# Patient Record
Sex: Male | Born: 1995 | Race: White | Hispanic: No | Marital: Married | Smoking: Never smoker
Health system: Southern US, Community
[De-identification: ages and names within clinical notes are randomized; demographics above are authoritative.]

## PROBLEM LIST (undated history)

## (undated) DIAGNOSIS — F329 Major depressive disorder, single episode, unspecified: Secondary | ICD-10-CM

## (undated) DIAGNOSIS — K219 Gastro-esophageal reflux disease without esophagitis: Secondary | ICD-10-CM

## (undated) DIAGNOSIS — F32A Depression, unspecified: Secondary | ICD-10-CM

## (undated) HISTORY — DX: Depression, unspecified: F32.A

## (undated) HISTORY — DX: Gastro-esophageal reflux disease without esophagitis: K21.9

## (undated) HISTORY — DX: Major depressive disorder, single episode, unspecified: F32.9

---
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
## 2015-02-02 ENCOUNTER — Ambulatory Visit (INDEPENDENT_AMBULATORY_CARE_PROVIDER_SITE_OTHER): Payer: 59 | Admitting: Family Medicine

## 2015-02-02 ENCOUNTER — Encounter: Payer: Self-pay | Admitting: Family Medicine

## 2015-02-02 VITALS — BP 128/81 | HR 65 | Temp 97.7°F | Ht 68.6 in | Wt 167.8 lb

## 2015-02-02 DIAGNOSIS — J029 Acute pharyngitis, unspecified: Secondary | ICD-10-CM | POA: Diagnosis not present

## 2015-02-02 DIAGNOSIS — R509 Fever, unspecified: Secondary | ICD-10-CM | POA: Diagnosis not present

## 2015-02-02 MED ORDER — BENZONATATE 100 MG PO CAPS: 100.0000 mg | ORAL_CAPSULE | Freq: Two times a day (BID) | ORAL | Status: DC | PRN

## 2015-02-02 NOTE — Patient Instructions (Signed)

## 2015-02-02 NOTE — Progress Notes (Signed)
BP 128/81 mmHg  Pulse 65  Temp(Src) 97.7 F (36.5 C) (Oral)  Ht 5' 8.6" (1.742 m)  Wt 167 lb 12.8 oz (76.114 kg)  BMI 25.08 kg/m2  SpO2 100%   Subjective:    Patient ID: Dustin Gross, male    DOB: 01/14/1996, 19 y.o.   MRN: [id number]  HPI: Dustin Gross is a 19 y.o. male  Chief Complaint  Patient presents with  . Sore Throat    Sore throat, fever, congestion, nausea    UPPER RESPIRATORY TRACT INFECTION- has been sick since Tuesday with low grade fever and sore throat. Starting to feel better, but still with a sore throat. Needs a note for work.  Worst symptom: sore throat Fever: yes Cough: yes Shortness of breath: no Wheezing: no Chest pain: no Chest tightness: no Chest congestion: no Nasal congestion: yes Runny nose: no Post nasal drip: yes Sneezing: no Sore throat: yes Swollen glands: yes Sinus pressure: no Headache: no Face pain: no Toothache: no Ear pain: no  Ear pressure: no  Eyes red/itching:no Eye drainage/crusting: no  Vomiting: no Rash: no Fatigue: yes Sick contacts: no Strep contacts: no  Context: better Recurrent sinusitis: no Relief with OTC cold/cough medications: no  Treatments attempted: none   Relevant past medical, surgical, family and social history reviewed and updated as indicated. Interim medical history since our last visit reviewed. Allergies and medications reviewed and updated.  Review of Systems  Constitutional: Negative.   HENT: Negative.   Respiratory: Negative.   Cardiovascular: Negative.   Psychiatric/Behavioral: Negative.     Per HPI unless specifically indicated above     Objective:    BP 128/81 mmHg  Pulse 65  Temp(Src) 97.7 F (36.5 C) (Oral)  Ht 5' 8.6" (1.742 m)  Wt 167 lb 12.8 oz (76.114 kg)  BMI 25.08 kg/m2  SpO2 100%  Wt Readings from Last 3 Encounters:  02/02/15 167 lb 12.8 oz (76.114 kg)  12/07/14 166 lb (75.297 kg)    Physical Exam  Constitutional: He is oriented to person, place,  and time. He appears well-developed and well-nourished. No distress.  HENT:  Head: Normocephalic and atraumatic.  Right Ear: Hearing and tympanic membrane normal.  Left Ear: Hearing and tympanic membrane normal.  Nose: Mucosal edema and rhinorrhea present. No nose lacerations, sinus tenderness, nasal deformity, septal deviation or nasal septal hematoma. No epistaxis.  No foreign bodies. Right sinus exhibits no maxillary sinus tenderness and no frontal sinus tenderness. Left sinus exhibits no maxillary sinus tenderness and no frontal sinus tenderness.  Mouth/Throat: Uvula is midline, oropharynx is clear and moist and mucous membranes are normal.  Eyes: Conjunctivae and lids are normal. Right eye exhibits no discharge. Left eye exhibits no discharge. No scleral icterus.  Cardiovascular: Normal rate, regular rhythm and normal heart sounds.  Exam reveals no gallop and no friction rub.   No murmur heard. Pulmonary/Chest: Effort normal and breath sounds normal. No respiratory distress. He has no wheezes. He has no rales. He exhibits no tenderness.  Musculoskeletal: Normal range of motion.  Neurological: He is alert and oriented to person, place, and time.  Skin: Skin is warm, dry and intact. No rash noted. No erythema. No pallor.  Psychiatric: He has a normal mood and affect. His speech is normal and behavior is normal. Judgment and thought content normal. Cognition and memory are normal.  Nursing note and vitals reviewed.       Assessment & Plan:   Problem List Items Addressed This Visit  None    Visit Diagnoses    Sore throat    -  Primary    Negative strep. Seems to be viral. Call if not getting better. Note for work given. Tessalon perles given for cough and sore throat. Continue to monitor.     Relevant Orders    Strep Gp A Ag, IA W/Reflex    Other specified fever        Resolved at this time. Continue to monitor.     Relevant Orders    Strep Gp A Ag, IA W/Reflex        Follow  up plan: Return if symptoms worsen or fail to improve.

## 2015-02-05 LAB — STREP GP A AG, IA W/REFLEX: Strep A Culture: NEGATIVE

## 2015-03-22 ENCOUNTER — Encounter: Payer: Self-pay | Admitting: *Deleted

## 2015-03-22 ENCOUNTER — Emergency Department
Admission: EM | Admit: 2015-03-22 | Discharge: 2015-03-22 | Disposition: A | Discharge status: Home / Self Care | Payer: 59 | Attending: Emergency Medicine | Admitting: Emergency Medicine

## 2015-03-22 ENCOUNTER — Emergency Department: Payer: 59

## 2015-03-22 DIAGNOSIS — Y998 Other external cause status: Secondary | ICD-10-CM | POA: Diagnosis not present

## 2015-03-22 DIAGNOSIS — M70831 Other soft tissue disorders related to use, overuse and pressure, right forearm: Secondary | ICD-10-CM | POA: Diagnosis not present

## 2015-03-22 DIAGNOSIS — Z79899 Other long term (current) drug therapy: Secondary | ICD-10-CM | POA: Insufficient documentation

## 2015-03-22 DIAGNOSIS — Z880 Allergy status to penicillin: Secondary | ICD-10-CM | POA: Diagnosis not present

## 2015-03-22 DIAGNOSIS — Y9389 Activity, other specified: Secondary | ICD-10-CM | POA: Insufficient documentation

## 2015-03-22 DIAGNOSIS — X58XXXA Exposure to other specified factors, initial encounter: Secondary | ICD-10-CM | POA: Diagnosis not present

## 2015-03-22 DIAGNOSIS — M778 Other enthesopathies, not elsewhere classified: Secondary | ICD-10-CM

## 2015-03-22 DIAGNOSIS — S4991XA Unspecified injury of right shoulder and upper arm, initial encounter: Secondary | ICD-10-CM | POA: Diagnosis present

## 2015-03-22 DIAGNOSIS — Y9289 Other specified places as the place of occurrence of the external cause: Secondary | ICD-10-CM | POA: Insufficient documentation

## 2015-03-22 MED ORDER — IBUPROFEN 800 MG PO TABS: 800.0000 mg | ORAL_TABLET | Freq: Three times a day (TID) | ORAL | Status: DC | PRN

## 2015-03-22 NOTE — Discharge Instructions (Signed)

## 2015-03-22 NOTE — ED Notes (Signed)
Pt states right elbow pain, pt has movement in arm, sensation intact, injury occurred from arm wrestling at work

## 2015-03-22 NOTE — ED Notes (Signed)
Pt reports right arm injury today, reports initial arm pain after arm wrestling. Last night worked out, lifted [employer]. Today has pain at elbow. States decreased grip.

## 2015-03-22 NOTE — ED Provider Notes (Signed)
[facility] Emergency Department Provider Note  ____________________________________________  Time seen: Approximately 5:41 PM  I have reviewed the triage vital signs and the nursing notes.   HISTORY  Chief Complaint Arm Injury    HPI Beryle Zeitz is a 19 y.o. male who presents with right elbow pain. Patient states that he initially hurt his right upper arm triceps area after arm wrestling 2 days ago. States any then worked out pretty hard at [employer] States today however he threw a ball and felt like his elbow may been thrown out. Complains of right elbow pain today.   Past Medical History  Diagnosis Date  . Gastroesophageal reflux     Aggravated by anxiety, nerves & certain foods  . Depression     Patient Active Problem List   Diagnosis Date Noted  . Gastroesophageal reflux     History reviewed. No pertinent past surgical history.  Current Outpatient Rx  Name  Route  Sig  Dispense  Refill  . benzonatate (TESSALON) 100 MG capsule   Oral   Take 1 capsule (100 mg total) by mouth 2 (two) times daily as needed for cough.   20 capsule   0   . esomeprazole (NEXIUM) 20 MG capsule   Oral   Take 20 mg by mouth daily at 12 noon.         Marland Kitchen ibuprofen (ADVIL,MOTRIN) 800 MG tablet   Oral   Take 1 tablet (800 mg total) by mouth every 8 (eight) hours as needed.   30 tablet   0     Allergies Penicillins and Sulfa antibiotics  Family History  Problem Relation Age of Onset  . Seizures Father   . Mental illness Father     Social History Social History  Substance Use Topics  . Smoking status: Never Smoker   . Smokeless tobacco: Never Used  . Alcohol Use: 9.6 oz/week    16 Cans of beer per week     Comment: drinks some liquor    Review of Systems Constitutional: No fever/chills Eyes: No visual changes. ENT: No sore throat. Cardiovascular: Denies chest pain. Respiratory: Denies shortness of breath. Gastrointestinal: No abdominal  pain.  No nausea, no vomiting.  No diarrhea.  No constipation. Genitourinary: Negative for dysuria. Musculoskeletal: Positive for right upper arm and elbow pain. Skin: Negative for rash. Neurological: Negative for headaches, focal weakness or numbness.  10-point ROS otherwise negative.  ____________________________________________   PHYSICAL EXAM:  VITAL SIGNS: ED Triage Vitals  Enc Vitals Group     BP 03/22/15 1723 135/49 mmHg     Pulse Rate 03/22/15 1723 56     Resp --      Temp 03/22/15 1723 98.2 F (36.8 C)     Temp Source 03/22/15 1723 Oral     SpO2 03/22/15 1723 98 %     Weight 03/22/15 1723 165 lb (74.844 kg)     Height 03/22/15 1723  (1.727 m)     Head Cir --      Peak Flow --      Pain Score --      Pain Loc --      Pain Edu? --      Excl. in GC? --     Constitutional: Alert and oriented. Well appearing and in no acute distress. Musculoskeletal: Right upper point tenderness to the triceps. Also limited range of motion with pronation supination to the right arm. Point tenderness to the elbow area no evidence of  edema or erythema. Neurologic:  Normal speech and language. No gross focal neurologic deficits are appreciated. No gait instability. Skin:  Skin is warm, dry and intact. No rash noted. Psychiatric: Mood and affect are normal. Speech and behavior are normal.  ____________________________________________   LABS (all labs ordered are listed, but only abnormal results are displayed)  Labs Reviewed - No data to display ____________________________________________  RADIOLOGY  Right elbow x-ray negative. Interpreted by radiologist and reviewed by myself. ____________________________________________   PROCEDURES  Procedure(s) performed: None  Critical Care performed: No  ____________________________________________   INITIAL IMPRESSION / ASSESSMENT AND PLAN / ED COURSE  Pertinent labs & imaging results that were available during my care of  the patient were reviewed by me and considered in my medical decision making (see chart for details).  Acute right elbow strain. Cannot rule out rotator cuff strain. Patient is to rest his arm over the next several days and started on anti-inflammatory of Motrin 800 mg 3 times a day. If symptoms do not get better is to follow up with his PCP for possible referral to orthopedics for evaluation. ____________________________________________   FINAL CLINICAL IMPRESSION(S) / ED DIAGNOSES  Final diagnoses:  Tendonitis of elbow, right      Evangeline Dakin, PA-C 03/22/15 1903  Sharman Cheek, MD 03/22/15 2245

## 2015-11-11 DIAGNOSIS — R112 Nausea with vomiting, unspecified: Secondary | ICD-10-CM | POA: Diagnosis not present

## 2015-12-18 ENCOUNTER — Ambulatory Visit (INDEPENDENT_AMBULATORY_CARE_PROVIDER_SITE_OTHER): Payer: 59 | Admitting: Unknown Physician Specialty

## 2015-12-18 ENCOUNTER — Encounter: Payer: Self-pay | Admitting: Unknown Physician Specialty

## 2015-12-18 VITALS — BP 126/85 | HR 76 | Temp 98.5°F | Ht 68.0 in | Wt 173.2 lb

## 2015-12-18 DIAGNOSIS — J069 Acute upper respiratory infection, unspecified: Secondary | ICD-10-CM

## 2015-12-18 MED ORDER — AZITHROMYCIN 250 MG PO TABS: ORAL_TABLET | ORAL | Status: DC

## 2015-12-18 NOTE — Progress Notes (Signed)
BP 126/85 mmHg  Pulse 76  Temp(Src) 98.5 F (36.9 C)  Ht  (1.727 m)  Wt 173 lb 3.2 oz (78.563 kg)  BMI 26.34 kg/m2  SpO2 96%   Subjective:    Patient ID: Dustin Gross, male    DOB: 08-16-1995, 20 y.o.   MRN: [id number]  HPI: Dustin Gross is a 20 y.o. male  Chief Complaint  Patient presents with  . URI    pt states he has had a sore throat, congestion, and sinus pressure for almost a week now   URI  This is a new (yesterday was worse but maybe a little better today) problem. The current episode started in the past 7 days. The problem has been gradually worsening. There has been no fever. Associated symptoms include congestion, dysuria and a sore throat. Pertinent negatives include no abdominal pain, chest pain, coughing, diarrhea, ear pain or headaches. He has tried decongestant for the symptoms. The treatment provided mild relief.     Relevant past medical, surgical, family and social history reviewed and updated as indicated. Interim medical history since our last visit reviewed. Allergies and medications reviewed and updated.  Review of Systems  HENT: Positive for congestion and sore throat. Negative for ear pain.   Respiratory: Negative for cough.   Cardiovascular: Negative for chest pain.  Gastrointestinal: Negative for abdominal pain and diarrhea.  Genitourinary: Positive for dysuria.  Neurological: Negative for headaches.    Per HPI unless specifically indicated above     Objective:    BP 126/85 mmHg  Pulse 76  Temp(Src) 98.5 F (36.9 C)  Ht  (1.727 m)  Wt 173 lb 3.2 oz (78.563 kg)  BMI 26.34 kg/m2  SpO2 96%  Wt Readings from Last 3 Encounters:  12/18/15 173 lb 3.2 oz (78.563 kg)  03/22/15 165 lb (74.844 kg)  02/02/15 167 lb 12.8 oz (76.114 kg)    Physical Exam  Constitutional: He is oriented to person, place, and time. He appears well-developed and well-nourished. No distress.  HENT:  Head: Normocephalic and atraumatic.  Right  Ear: Tympanic membrane and ear canal normal.  Left Ear: Tympanic membrane and ear canal normal.  Nose: Rhinorrhea present. Right sinus exhibits no maxillary sinus tenderness and no frontal sinus tenderness. Left sinus exhibits no maxillary sinus tenderness and no frontal sinus tenderness.  Mouth/Throat: Uvula is midline. Posterior oropharyngeal edema present.  Eyes: Conjunctivae and lids are normal. Right eye exhibits no discharge. Left eye exhibits no discharge. No scleral icterus.  Neck: Neck supple.  Cardiovascular: Normal rate, regular rhythm and normal heart sounds.   Pulmonary/Chest: Effort normal and breath sounds normal. No respiratory distress.  Abdominal: Normal appearance. There is no splenomegaly or hepatomegaly.  Musculoskeletal: Normal range of motion.  Neurological: He is alert and oriented to person, place, and time.  Skin: Skin is warm, dry and intact. No rash noted. No pallor.  Psychiatric: He has a normal mood and affect. His behavior is normal. Judgment and thought content normal.  Nursing note and vitals reviewed.   Results for orders placed or performed in visit on 02/02/15  Strep Gp A Ag, IA W/Reflex  Result Value Ref Range   Strep A Culture Negative       Assessment & Plan:   Problem List Items Addressed This Visit    None    Visit Diagnoses    URI (upper respiratory infection)    -  Primary       Antibiotic given to only  take if symptoms worse after 5 days or no improvement after 10 days.  Pt will hold on to antibiotic for now   Follow up plan: Return if symptoms worsen or fail to improve.

## 2016-01-15 IMAGING — CR DG ELBOW COMPLETE 3+V*R*
1 series · 4 of 4 positions shown · non-contrast
Comparison: None.

CLINICAL DATA: Pain after exercise and arm wrestling

EXAM:
RIGHT ELBOW - COMPLETE 3+ VIEW

[Series 1: ap · 0.17mm/px · 4 of 4 slices shown]
[im 1/4]
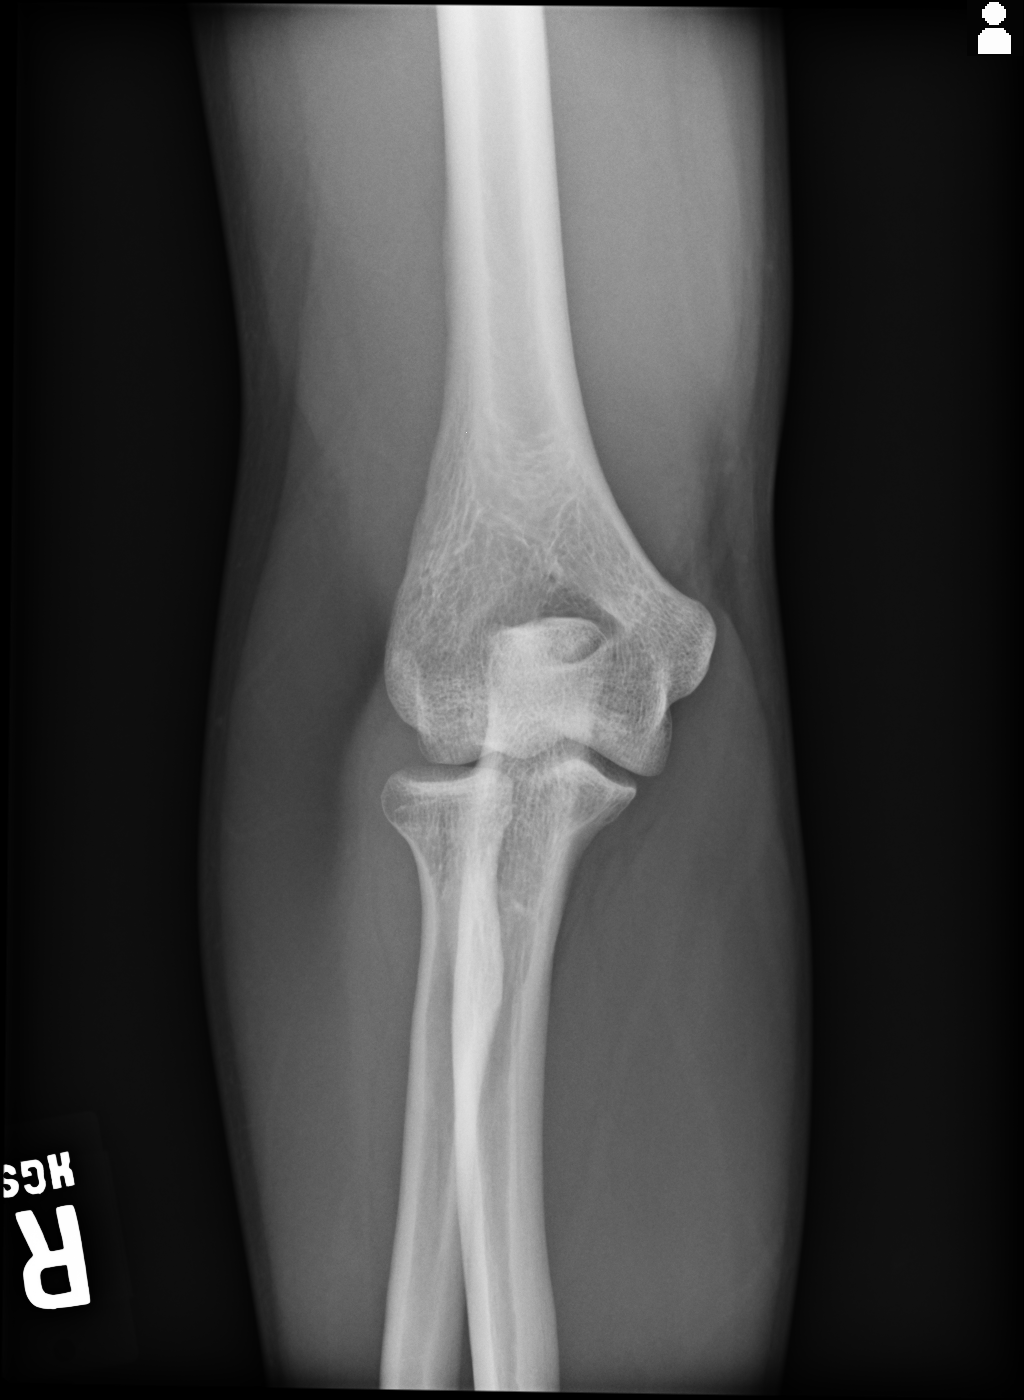
[im 2/4]
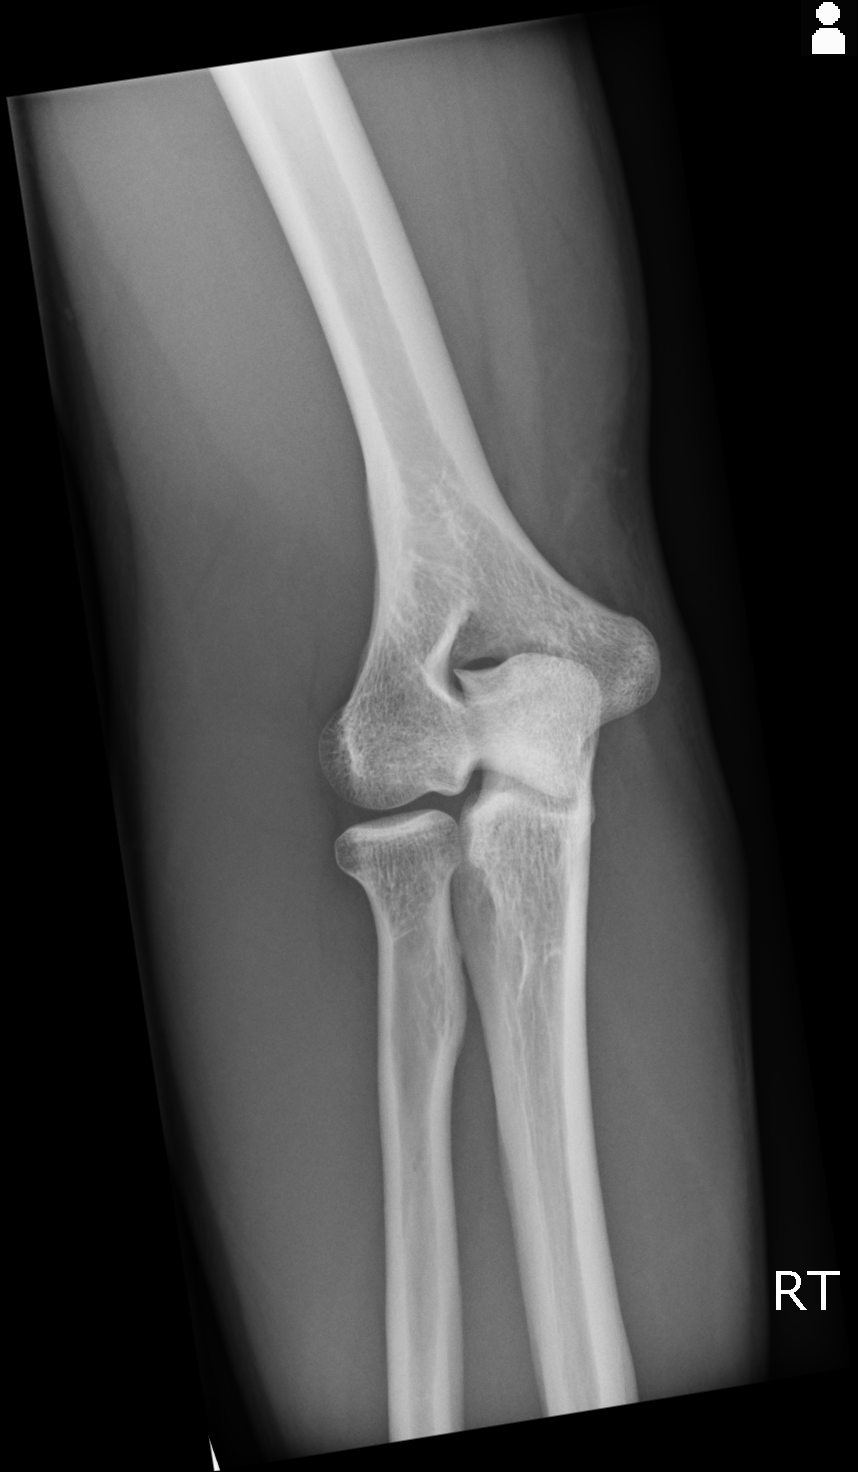
[im 3/4]
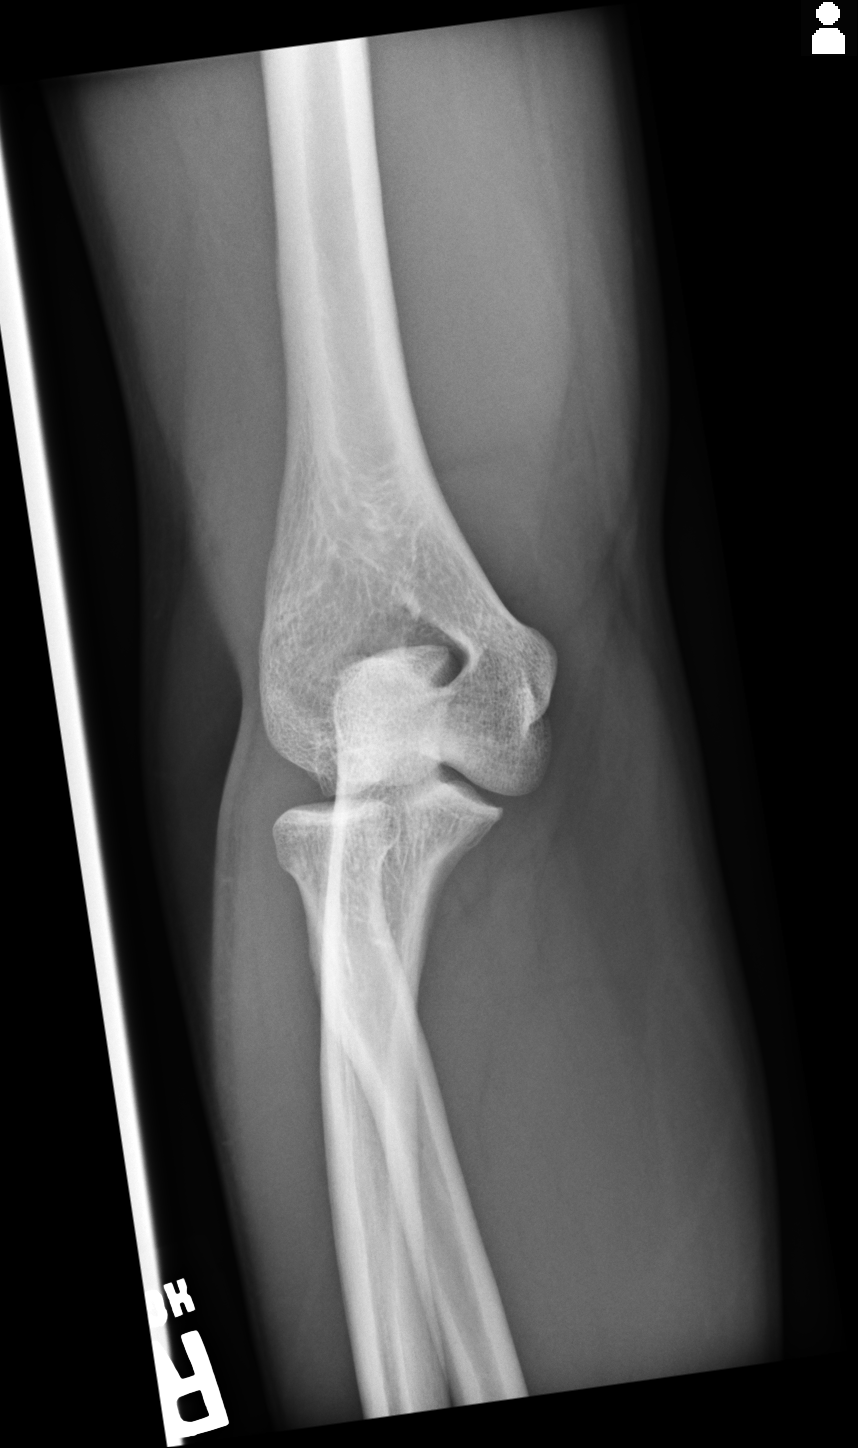
[im 4/4]
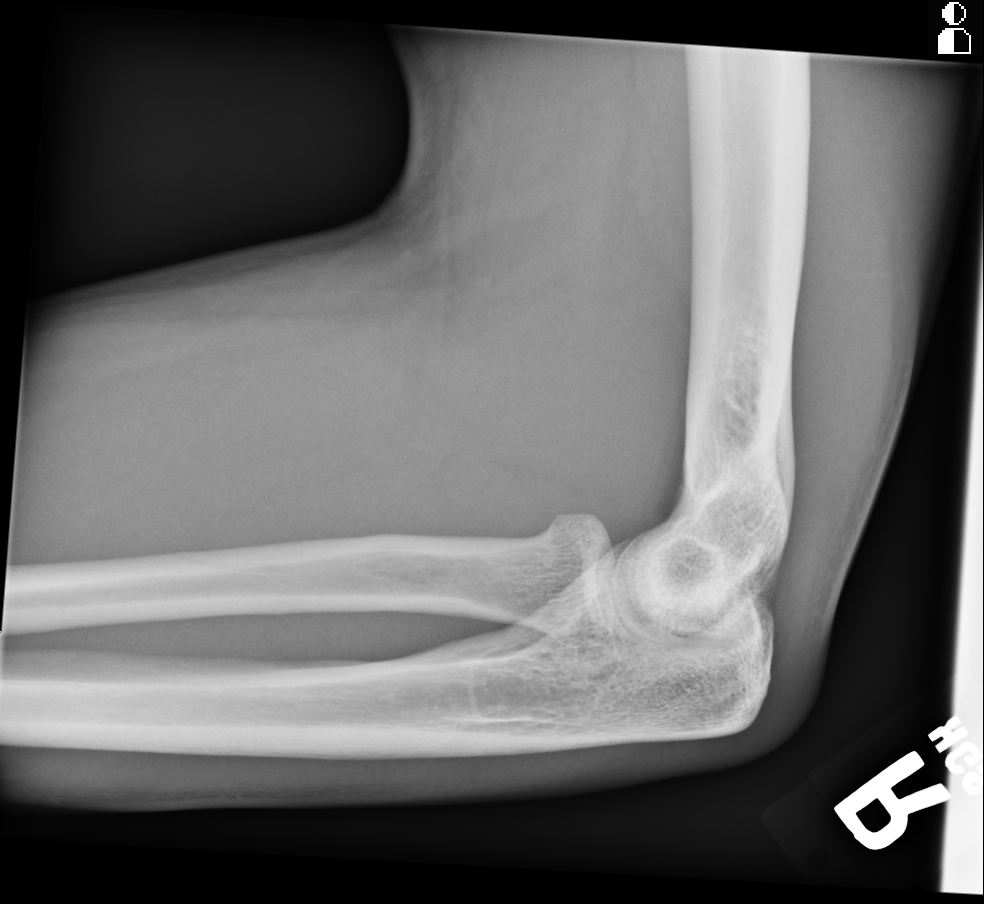

[4 of 4 positions shown; findings below may reference images not displayed]

FINDINGS: Frontal, lateral, and bilateral oblique views obtained. There is no
fracture or dislocation. No joint effusion. Joint spaces appear
intact. No erosive change.
IMPRESSION: No fracture or dislocation.  No appreciable arthropathy.

## 2016-02-06 ENCOUNTER — Encounter: Payer: Self-pay | Admitting: Family Medicine

## 2016-02-06 ENCOUNTER — Ambulatory Visit (INDEPENDENT_AMBULATORY_CARE_PROVIDER_SITE_OTHER): Payer: 59 | Admitting: Family Medicine

## 2016-02-06 VITALS — BP 117/65 | HR 61 | Temp 97.7°F | Ht 69.0 in | Wt 183.0 lb

## 2016-02-06 DIAGNOSIS — K219 Gastro-esophageal reflux disease without esophagitis: Secondary | ICD-10-CM

## 2016-02-06 MED ORDER — PANTOPRAZOLE SODIUM 40 MG PO TBEC: 40.0000 mg | DELAYED_RELEASE_TABLET | Freq: Every day | ORAL | Status: DC

## 2016-02-06 NOTE — Patient Instructions (Signed)
Gastroesophageal Reflux Disease, Adult °Normally, food travels down the esophagus and stays in the stomach to be digested. If a person has gastroesophageal reflux disease (GERD), food and stomach acid move back up into the esophagus. When this happens, the esophagus becomes sore and swollen (inflamed). Over time, GERD can make small holes (ulcers) in the lining of the esophagus. °HOME CARE °Diet  °· Follow a diet as told by your doctor. You may need to avoid foods and drinks such as: °· Coffee and tea (with or without caffeine). °· Drinks that contain alcohol. °· Energy drinks and sports drinks. °· Carbonated drinks or sodas. °· Chocolate and cocoa. °· Peppermint and mint flavorings. °· Garlic and onions. °· Horseradish. °· Spicy and acidic foods, such as peppers, chili powder, curry powder, vinegar, hot sauces, and BBQ sauce. °· Citrus fruit juices and citrus fruits, such as oranges, lemons, and limes. °· Tomato-based foods, such as red sauce, chili, salsa, and pizza with red sauce. °· Fried and fatty foods, such as donuts, french fries, potato chips, and high-fat dressings. °· High-fat meats, such as hot dogs, rib eye steak, sausage, ham, and bacon. °· High-fat dairy items, such as whole milk, butter, and cream cheese. °· Eat small meals often. Avoid eating large meals. °· Avoid drinking large amounts of liquid with your meals. °· Avoid eating meals during the 2-3 hours before bedtime. °· Avoid lying down right after you eat. °· Do not exercise right after you eat. ° General Instructions  °· Pay attention to any changes in your symptoms. °· Take over-the-counter and prescription medicines only as told by your doctor. Do not take aspirin, ibuprofen, or other NSAIDs unless your doctor says it is okay. °· Do not use any tobacco products, including cigarettes, chewing tobacco, and e-cigarettes. If you need help quitting, ask your doctor. °· Wear loose clothes. Do not wear anything tight around your waist. °· Raise  (elevate) the head of your bed about 6 inches (15 cm). °· Try to lower your stress. If you need help doing this, ask your doctor. °· If you are overweight, lose an amount of weight that is healthy for you. Ask your doctor about a safe weight loss goal. °· Keep all follow-up visits as told by your doctor. This is important. °GET HELP IF: °· You have new symptoms. °· You lose weight and you do not know why it is happening. °· You have trouble swallowing, or it hurts to swallow. °· You have wheezing or a cough that keeps happening. °· Your symptoms do not get better with treatment. °· You have a hoarse voice. °GET HELP RIGHT AWAY IF: °· You have pain in your arms, neck, jaw, teeth, or back. °· You feel sweaty, dizzy, or light-headed. °· You have chest pain or shortness of breath. °· You throw up (vomit) and your throw up looks like blood or coffee grounds. °· You pass out (faint). °· Your poop (stool) is bloody or black. °· You cannot swallow, drink, or eat. °  °This information is not intended to replace advice given to you by your health care provider. Make sure you discuss any questions you have with your health care provider. °  °Document Released: 01/15/2008 Document Revised: 04/19/2015 Document Reviewed: 11/23/2014 °Elsevier Interactive Patient Education ©2016 [employer] ° °Food Choices for Gastroesophageal Reflux Disease, Adult °When you have gastroesophageal reflux disease (GERD), the foods you eat and your eating habits are very important. Choosing the right foods can help ease your discomfort.  °WHAT GUIDELINES   DO I NEED TO FOLLOW?  °· Choose fruits, vegetables, whole grains, and low-fat dairy products.   °· Choose low-fat meat, fish, and poultry. °· Limit fats such as oils, salad dressings, butter, nuts, and avocado.   °· Keep a food diary. This helps you identify foods that cause symptoms.   °· Avoid foods that cause symptoms. These may be different for everyone.   °· Eat small meals often instead of 3  large meals a day.   °· Eat your meals slowly, in a place where you are relaxed.   °· Limit fried foods.   °· Cook foods using methods other than frying.   °· Avoid drinking alcohol.   °· Avoid drinking large amounts of liquids with your meals.   °· Avoid bending over or lying down until 2-3 hours after eating.   °WHAT FOODS ARE NOT RECOMMENDED?  °These are some foods and drinks that may make your symptoms worse: °Vegetables °Tomatoes. Tomato juice. Tomato and spaghetti sauce. Chili peppers. Onion and garlic. Horseradish. °Fruits °Oranges, grapefruit, and lemon (fruit and juice). °Meats °High-fat meats, fish, and poultry. This includes hot dogs, ribs, ham, sausage, salami, and bacon. °Dairy °Whole milk and chocolate milk. Sour cream. Cream. Butter. Ice cream. Cream cheese.  °Drinks °Coffee and tea. Bubbly (carbonated) drinks or energy drinks. °Condiments °Hot sauce. Barbecue sauce.  °Sweets/Desserts °Chocolate and cocoa. Donuts. Peppermint and spearmint. °Fats and Oils °High-fat foods. This includes French fries and potato chips. °Other °Vinegar. Strong spices. This includes black pepper, white pepper, red pepper, cayenne, curry powder, cloves, ginger, and chili powder. °The items listed above may not be a complete list of foods and drinks to avoid. Contact your dietitian for more information. °  °This information is not intended to replace advice given to you by your health care provider. Make sure you discuss any questions you have with your health care provider. °  °Document Released: 01/28/2012 Document Revised: 08/19/2014 Document Reviewed: 06/02/2013 °Elsevier Interactive Patient Education ©2016 [employer] ° °

## 2016-02-06 NOTE — Progress Notes (Signed)
BP 117/65 mmHg  Pulse 61  Temp(Src) 97.7 F (36.5 C)  Ht 5\' 9"  (1.753 m)  Wt 183 lb (83.008 kg)  BMI 27.01 kg/m2  SpO2 98%   Subjective:    Patient ID: Dustin EdwardsNicholas Gross, male    DOB: Jan 02, 1996, 20 y.o.   MRN: [id number]  HPI: Dustin Gross is a 20 y.o. male  Chief Complaint  Patient presents with  . Gastroesophageal Reflux   GERD GERD control status: uncontrolled  Satisfied with current treatment? no Heartburn frequency: all the time without medicine Medication side effects: no  Medication compliance: off and on Previous GERD medications: pantoprazole and nexium, omeprazole- didn't really work Antacid use frequency:  never Duration: most of the day Nature: burning Location: epigastric Heartburn duration: most of the day  Alleviatiating factors:  Mustard, medicine Aggravating factors: spicy food Dysphagia: no Odynophagia:  no Hematemesis: no Blood in stool: no EGD: no  Relevant past medical, surgical, family and social history reviewed and updated as indicated. Interim medical history since our last visit reviewed. Allergies and medications reviewed and updated.  Review of Systems  Constitutional: Negative.   Respiratory: Negative.   Cardiovascular: Negative.   Gastrointestinal: Positive for abdominal pain. Negative for nausea, vomiting, diarrhea, constipation, blood in stool, abdominal distention, anal bleeding and rectal pain.  Psychiatric/Behavioral: Negative.     Per HPI unless specifically indicated above     Objective:    BP 117/65 mmHg  Pulse 61  Temp(Src) 97.7 F (36.5 C)  Ht 5\' 9"  (1.753 m)  Wt 183 lb (83.008 kg)  BMI 27.01 kg/m2  SpO2 98%  Wt Readings from Last 3 Encounters:  02/06/16 183 lb (83.008 kg)  12/18/15 173 lb 3.2 oz (78.563 kg)  03/22/15 165 lb (74.844 kg)    Physical Exam  Constitutional: He is oriented to person, place, and time. He appears well-developed and well-nourished. No distress.  HENT:  Head: Normocephalic and  atraumatic.  Right Ear: Hearing and external ear normal.  Left Ear: Hearing and external ear normal.  Nose: Nose normal.  Mouth/Throat: Oropharynx is clear and moist. No oropharyngeal exudate.  Eyes: Conjunctivae, EOM and lids are normal. Pupils are equal, round, and reactive to light. Right eye exhibits no discharge. Left eye exhibits no discharge. No scleral icterus.  Pulmonary/Chest: Effort normal.  Musculoskeletal: Normal range of motion.  Neurological: He is alert and oriented to person, place, and time.  Skin: Skin is warm, dry and intact. No rash noted. He is not diaphoretic. No erythema. No pallor.  Psychiatric: He has a normal mood and affect. His speech is normal and behavior is normal. Judgment and thought content normal. Cognition and memory are normal.  Nursing note and vitals reviewed.   Results for orders placed or performed in visit on 02/02/15  Strep Gp A Ag, IA W/Reflex  Result Value Ref Range   Strep A Culture Negative       Assessment & Plan:   Problem List Items Addressed This Visit      Digestive   Gastroesophageal reflux - Primary    Failed nexium and omeprazole in the past. Did well with pantoprazole. Will restart. Recheck in 1 month, if not better, consider referral to GI rather than long-term use of PPI.      Relevant Medications   pantoprazole (PROTONIX) 40 MG tablet       Follow up plan: Return in about 4 weeks (around 03/05/2016) for Follow up GERD.

## 2016-02-06 NOTE — Assessment & Plan Note (Signed)
Failed nexium and omeprazole in the past. Did well with pantoprazole. Will restart. Recheck in 1 month, if not better, consider referral to GI rather than long-term use of PPI.

## 2016-03-07 ENCOUNTER — Encounter: Payer: Self-pay | Admitting: Family Medicine

## 2016-03-07 ENCOUNTER — Ambulatory Visit (INDEPENDENT_AMBULATORY_CARE_PROVIDER_SITE_OTHER): Payer: 59 | Admitting: Family Medicine

## 2016-03-07 VITALS — BP 124/77 | HR 57 | Temp 98.4°F | Ht 68.5 in | Wt 185.4 lb

## 2016-03-07 DIAGNOSIS — K219 Gastro-esophageal reflux disease without esophagitis: Secondary | ICD-10-CM

## 2016-03-07 MED ORDER — PANTOPRAZOLE SODIUM 40 MG PO TBEC: 40.0000 mg | DELAYED_RELEASE_TABLET | Freq: Every day | ORAL | 6 refills | Status: DC

## 2016-03-07 NOTE — Assessment & Plan Note (Signed)
Better on pantoprazole. Feeling well. Will consider GI referral so not on medicine long term. Call if he wants the appointment. Recheck 6 months for physical.

## 2016-03-07 NOTE — Progress Notes (Signed)
BP 124/77 (BP Location: Left Arm, Patient Position: Sitting, Cuff Size: Large)   Pulse (!) 57   Temp 98.4 F (36.9 C)   Ht 5' 8.5" (1.74 m) Comment: pt had shoes on  Wt 185 lb 6.4 oz (84.1 kg) Comment: pt had shoes on  SpO2 99%   BMI 27.78 kg/m    Subjective:    Patient ID: Dustin Gross, male    DOB: 15-Nov-1995, 20 y.o.   MRN: [id number]  HPI: Dustin Gross is a 20 y.o. male  Chief Complaint  Patient presents with  . Gastroesophageal Reflux    1 month f/up   GERD GERD control status: controlled  Satisfied with current treatment? yes Heartburn frequency: only when he doesn't take his medicine Medication side effects: no  Medication compliance: excellent Previous GERD medications: omeprazole, nexium Antacid use frequency:  rarely Duration: hours Nature: burning Location: substernal Heartburn duration: hours Alleviatiating factors:  nothing Aggravating factors: food Dysphagia: no Odynophagia:  no Hematemesis: no Blood in stool: no EGD: no   Relevant past medical, surgical, family and social history reviewed and updated as indicated. Interim medical history since our last visit reviewed. Allergies and medications reviewed and updated.  Review of Systems  Constitutional: Negative.   Respiratory: Negative.   Cardiovascular: Negative.   Gastrointestinal: Negative.   Psychiatric/Behavioral: Negative.     Per HPI unless specifically indicated above     Objective:    BP 124/77 (BP Location: Left Arm, Patient Position: Sitting, Cuff Size: Large)   Pulse (!) 57   Temp 98.4 F (36.9 C)   Ht 5' 8.5" (1.74 m) Comment: pt had shoes on  Wt 185 lb 6.4 oz (84.1 kg) Comment: pt had shoes on  SpO2 99%   BMI 27.78 kg/m   Wt Readings from Last 3 Encounters:  03/07/16 185 lb 6.4 oz (84.1 kg)  02/06/16 183 lb (83 kg)  12/18/15 173 lb 3.2 oz (78.6 kg)    Physical Exam  Constitutional: He is oriented to person, place, and time. He appears well-developed and  well-nourished. No distress.  HENT:  Head: Normocephalic and atraumatic.  Right Ear: Hearing and external ear normal.  Left Ear: Hearing and external ear normal.  Nose: Nose normal.  Mouth/Throat: Oropharynx is clear and moist. No oropharyngeal exudate.  Eyes: Conjunctivae and lids are normal. Right eye exhibits no discharge. Left eye exhibits no discharge. No scleral icterus.  Pulmonary/Chest: Effort normal. No respiratory distress.  Musculoskeletal: Normal range of motion.  Neurological: He is alert and oriented to person, place, and time.  Skin: Skin is warm, dry and intact. No rash noted. He is not diaphoretic. No erythema. No pallor.  Psychiatric: He has a normal mood and affect. His speech is normal and behavior is normal. Judgment and thought content normal. Cognition and memory are normal.    Results for orders placed or performed in visit on 02/02/15  Strep Gp A Ag, IA W/Reflex  Result Value Ref Range   Strep A Culture Negative       Assessment & Plan:   Problem List Items Addressed This Visit      Digestive   Gastroesophageal reflux - Primary    Better on pantoprazole. Feeling well. Will consider GI referral so not on medicine long term. Call if he wants the appointment. Recheck 6 months for physical.      Relevant Medications   pantoprazole (PROTONIX) 40 MG tablet    Other Visit Diagnoses   None.  Follow up plan: Return in about 6 months (around 09/07/2016) for Physical.

## 2016-04-17 ENCOUNTER — Ambulatory Visit (INDEPENDENT_AMBULATORY_CARE_PROVIDER_SITE_OTHER): Payer: 59 | Admitting: Family Medicine

## 2016-04-17 ENCOUNTER — Encounter: Payer: Self-pay | Admitting: Family Medicine

## 2016-04-17 VITALS — BP 113/73 | HR 92 | Temp 98.9°F | Wt 182.0 lb

## 2016-04-17 DIAGNOSIS — R05 Cough: Secondary | ICD-10-CM

## 2016-04-17 DIAGNOSIS — R059 Cough, unspecified: Secondary | ICD-10-CM

## 2016-04-17 MED ORDER — BENZONATATE 100 MG PO CAPS: 100.0000 mg | ORAL_CAPSULE | Freq: Two times a day (BID) | ORAL | 0 refills | Status: DC | PRN

## 2016-04-17 NOTE — Progress Notes (Signed)
   BP 113/73   Pulse 92   Temp 98.9 F (37.2 C)   Wt 182 lb (82.6 kg)   SpO2 97%   BMI 27.27 kg/m    Subjective:    Patient ID: Dustin Gross, male    DOB: Dec 30, 1995, 20 y.o.   MRN: [id number]  HPI: Dustin Gross is a 20 y.o. male  Chief Complaint  Patient presents with  . URI    started Monday night, non productive cough, chest congestion, some fever, some sore throat from coughing, no ear pain/pressure.    Patient presents with cough and fever (tmax 100.8) x 2 days. Denies body aches, chills, ear pain, sinus pressure, nasal congestion. Not taking anything over the counter for symptoms other than ibuprofen. Last dose was 7 am this morning. States the cough seems some better this morning but yesterday and early this morning was persistent and very bothersome.   Relevant past medical, surgical, family and social history reviewed and updated as indicated. Interim medical history since our last visit reviewed. Allergies and medications reviewed and updated.  Review of Systems  Constitutional: Positive for fever.  HENT: Negative.   Eyes: Negative.   Respiratory: Positive for cough.   Gastrointestinal: Negative.   Genitourinary: Negative.   Musculoskeletal: Negative.   Skin: Negative.   Neurological: Negative.   Psychiatric/Behavioral: Negative.     Per HPI unless specifically indicated above     Objective:    BP 113/73   Pulse 92   Temp 98.9 F (37.2 C)   Wt 182 lb (82.6 kg)   SpO2 97%   BMI 27.27 kg/m   Wt Readings from Last 3 Encounters:  04/17/16 182 lb (82.6 kg)  03/07/16 185 lb 6.4 oz (84.1 kg)  02/06/16 183 lb (83 kg)    Physical Exam  Constitutional: He is oriented to person, place, and time. He appears well-developed and well-nourished. No distress.  HENT:  Head: Atraumatic.  Right Ear: External ear normal.  Left Ear: External ear normal.  Nose: Nose normal.  Mouth/Throat: Oropharynx is clear and moist. No oropharyngeal exudate.  Eyes:  Conjunctivae are normal. No scleral icterus.  Neck: Normal range of motion. Neck supple.  Cardiovascular: Normal rate and normal heart sounds.   Pulmonary/Chest: Effort normal and breath sounds normal. No respiratory distress.  Musculoskeletal: Normal range of motion.  Lymphadenopathy:    He has no cervical adenopathy.  Neurological: He is alert and oriented to person, place, and time.  Skin: Skin is warm and dry.  Psychiatric: He has a normal mood and affect. His behavior is normal.  Nursing note and vitals reviewed.       Assessment & Plan:   Problem List Items Addressed This Visit    None    Visit Diagnoses    Cough    -  Primary   Likely viral, will treat with tessalon perles, nyquil for sleep, and rest/good hydration. Continue ibuprofen as needed for fever.        Follow up plan: Return if symptoms worsen or fail to improve.

## 2016-04-22 ENCOUNTER — Encounter: Payer: Self-pay | Admitting: Family Medicine

## 2016-04-22 ENCOUNTER — Ambulatory Visit (INDEPENDENT_AMBULATORY_CARE_PROVIDER_SITE_OTHER): Payer: 59 | Admitting: Family Medicine

## 2016-04-22 VITALS — BP 142/83 | HR 98 | Temp 98.2°F | Ht 69.3 in | Wt 180.4 lb

## 2016-04-22 DIAGNOSIS — J069 Acute upper respiratory infection, unspecified: Secondary | ICD-10-CM

## 2016-04-22 MED ORDER — AZITHROMYCIN 250 MG PO TABS: ORAL_TABLET | ORAL | 0 refills | Status: DC

## 2016-04-22 MED ORDER — HYDROCOD POLST-CPM POLST ER 10-8 MG/5ML PO SUER: 5.0000 mL | Freq: Two times a day (BID) | ORAL | 0 refills | Status: DC | PRN

## 2016-04-22 NOTE — Progress Notes (Signed)
BP (!) 142/83 (BP Location: Right Arm, Patient Position: Sitting, Cuff Size: Large)   Pulse 98   Temp 98.2 F (36.8 C)   Ht 5' 9.3" (1.76 m) Comment: pt had shoes on  Wt 180 lb 6.4 oz (81.8 kg) Comment: pt had shoes on  SpO2 96%   BMI 26.41 kg/m    Subjective:    Patient ID: Dustin Gross, male    DOB: 1995/10/11, 20 y.o.   MRN: [id number]  HPI: Dustin Gross is a 20 y.o. male  Chief Complaint  Patient presents with  . URI    pt states he still has a cough, off and on fever, and some chest congestion. States he has not gotten better from last week and feels he has gotten worse   Patient presents following up from visit last week for URI. Has tried the OTC recommendations and is worsening. Persistent productive cough, intermittent SOB, fevers several times a day, and congestion. Taking ibuprofen to help reduce fevers as they come. Has missed several days of work from illness.   Relevant past medical, surgical, family and social history reviewed and updated as indicated. Interim medical history since our last visit reviewed. Allergies and medications reviewed and updated.  Review of Systems  Constitutional: Positive for chills, diaphoresis and fever.  HENT: Positive for congestion.   Respiratory: Positive for cough.   Cardiovascular: Negative.   Gastrointestinal: Negative.   Genitourinary: Negative.   Musculoskeletal: Negative.   Neurological: Negative.   Psychiatric/Behavioral: Negative.     Per HPI unless specifically indicated above     Objective:    BP (!) 142/83 (BP Location: Right Arm, Patient Position: Sitting, Cuff Size: Large)   Pulse 98   Temp 98.2 F (36.8 C)   Ht 5' 9.3" (1.76 m) Comment: pt had shoes on  Wt 180 lb 6.4 oz (81.8 kg) Comment: pt had shoes on  SpO2 96%   BMI 26.41 kg/m   Wt Readings from Last 3 Encounters:  04/22/16 180 lb 6.4 oz (81.8 kg)  04/17/16 182 lb (82.6 kg)  03/07/16 185 lb 6.4 oz (84.1 kg)    Physical Exam    Constitutional: He is oriented to person, place, and time. He appears well-developed and well-nourished. No distress.  HENT:  Head: Atraumatic.  Right Ear: External ear normal.  Left Ear: External ear normal.  Nose: Nose normal.  Mouth/Throat: No oropharyngeal exudate.  Oropharynx erythematous  Eyes: Conjunctivae are normal. No scleral icterus.  Neck: Normal range of motion. Neck supple.  Cardiovascular: Normal rate, regular rhythm and normal heart sounds.   Pulmonary/Chest: Effort normal and breath sounds normal. No respiratory distress.  Musculoskeletal: Normal range of motion.  Lymphadenopathy:    He has no cervical adenopathy.  Neurological: He is alert and oriented to person, place, and time.  Skin: Skin is warm and dry.  Psychiatric: He has a normal mood and affect. His behavior is normal.      Assessment & Plan:   Problem List Items Addressed This Visit    None    Visit Diagnoses    Upper respiratory infection    -  Primary   Given duration and worsening course, will send azithromycin and tussionex. Continue ibuprofen for fever reduction and tessalon perles for daytime cough symptoms   Relevant Medications   azithromycin (ZITHROMAX) 250 MG tablet     Discussed risks and warnings with the tussionex syrup. Patient understanding and agreeable to plan.   Follow up plan: Return if symptoms  worsen or fail to improve.

## 2016-04-22 NOTE — Patient Instructions (Signed)
Follow up as needed

## 2016-06-21 DIAGNOSIS — H5211 Myopia, right eye: Secondary | ICD-10-CM | POA: Diagnosis not present

## 2016-06-27 ENCOUNTER — Ambulatory Visit (INDEPENDENT_AMBULATORY_CARE_PROVIDER_SITE_OTHER): Payer: 59

## 2016-06-27 DIAGNOSIS — Z23 Encounter for immunization: Secondary | ICD-10-CM | POA: Diagnosis not present

## 2016-07-31 ENCOUNTER — Encounter: Payer: Self-pay | Admitting: Family Medicine

## 2016-07-31 ENCOUNTER — Ambulatory Visit (INDEPENDENT_AMBULATORY_CARE_PROVIDER_SITE_OTHER): Payer: 59 | Admitting: Family Medicine

## 2016-07-31 VITALS — BP 141/84 | HR 68 | Temp 97.8°F | Wt 172.0 lb

## 2016-07-31 DIAGNOSIS — R309 Painful micturition, unspecified: Secondary | ICD-10-CM

## 2016-07-31 LAB — UA/M W/RFLX CULTURE, ROUTINE
BILIRUBIN UA: NEGATIVE
GLUCOSE, UA: NEGATIVE
KETONES UA: NEGATIVE
Leukocytes, UA: NEGATIVE
NITRITE UA: NEGATIVE
Protein, UA: NEGATIVE
RBC UA: NEGATIVE
SPEC GRAV UA: 1.02 (ref 1.005–1.030)
Urobilinogen, Ur: 0.2 mg/dL (ref 0.2–1.0)
pH, UA: 6.5 (ref 5.0–7.5)

## 2016-07-31 LAB — MICROSCOPIC EXAMINATION
Bacteria, UA: NONE SEEN
EPITHELIAL CELLS (NON RENAL): NONE SEEN /HPF (ref 0–10)
RBC MICROSCOPIC, UA: NONE SEEN /HPF (ref 0–?)
WBC, UA: NONE SEEN /hpf (ref 0–?)

## 2016-07-31 NOTE — Patient Instructions (Signed)
Follow up as needed

## 2016-07-31 NOTE — Progress Notes (Signed)
   BP (!) 141/84 (BP Location: Left Arm, Patient Position: Sitting, Cuff Size: Normal)   Pulse 68   Temp 97.8 F (36.6 C)   Wt 172 lb (78 kg)   SpO2 100%   BMI 25.18 kg/m    Subjective:    Patient ID: Dustin Gross, male    DOB: 03/19/1996, 20 y.o.   MRN: [id number]  HPI: Dustin Gross is a 20 y.o. male  Chief Complaint  Patient presents with  . Urinary Tract Infection    pt states he has had pain in groin area that started yesterday   Patient presents with dysuria and suprapubic pressure since yesterday. Denies fever, chills, N/V/D, back pain, hematuria, penile discharge, or testicular pain. States he has no concern for STDs at this time and would prefer not to be tested for those. Does state he stays very well hydrated and has never had this sensation before. Not taking anything OTC for sxs. States the discomfort is much less today than it was yesterday.   Relevant past medical, surgical, family and social history reviewed and updated as indicated. Interim medical history since our last visit reviewed. Allergies and medications reviewed and updated.  Review of Systems  Constitutional: Negative.   HENT: Negative.   Eyes: Negative.   Respiratory: Negative.   Cardiovascular: Negative.   Gastrointestinal: Positive for abdominal pain.  Genitourinary: Positive for dysuria.  Musculoskeletal: Negative.   Skin: Negative.   Neurological: Negative.   Psychiatric/Behavioral: Negative.     Per HPI unless specifically indicated above     Objective:    BP (!) 141/84 (BP Location: Left Arm, Patient Position: Sitting, Cuff Size: Normal)   Pulse 68   Temp 97.8 F (36.6 C)   Wt 172 lb (78 kg)   SpO2 100%   BMI 25.18 kg/m   Wt Readings from Last 3 Encounters:  07/31/16 172 lb (78 kg)  04/22/16 180 lb 6.4 oz (81.8 kg)  04/17/16 182 lb (82.6 kg)    Physical Exam  Constitutional: He is oriented to person, place, and time. He appears well-developed and well-nourished.    HENT:  Head: Atraumatic.  Eyes: Conjunctivae are normal. Pupils are equal, round, and reactive to light. No scleral icterus.  Neck: Normal range of motion. Neck supple.  Cardiovascular: Normal rate, regular rhythm and normal heart sounds.   Pulmonary/Chest: Effort normal and breath sounds normal. No respiratory distress.  Abdominal: Soft. Bowel sounds are normal. He exhibits no distension. There is no tenderness. No hernia.  Genitourinary:  Genitourinary Comments: declined  Musculoskeletal: Normal range of motion.  No CVA tenderness  Lymphadenopathy:    He has no cervical adenopathy.  Neurological: He is alert and oriented to person, place, and time.  Skin: Skin is warm and dry.  Psychiatric: He has a normal mood and affect. His behavior is normal.  Nursing note and vitals reviewed.     Assessment & Plan:   Problem List Items Addressed This Visit    None    Visit Diagnoses    Painful urination    -  Primary   U/A negative, discussed good hydration, flushing system with cranberry juice in case any bacteria present. Will run GC/Chlamydia just in case   Relevant Orders   UA/M w/rflx Culture, Routine   GC/Chlamydia Probe Amp       Follow up plan: Return if symptoms worsen or fail to improve.

## 2016-08-02 ENCOUNTER — Telehealth: Payer: Self-pay | Admitting: Family Medicine

## 2016-08-02 LAB — GC/CHLAMYDIA PROBE AMP
Chlamydia trachomatis, NAA: NEGATIVE
Neisseria gonorrhoeae by PCR: NEGATIVE

## 2016-08-02 NOTE — Telephone Encounter (Signed)
Please call pt and let him know that his GC/Chlamydia results came back negative. Thanks

## 2016-08-02 NOTE — Telephone Encounter (Signed)
Tried calling patient. No answer, no voicemail set up. Will try calling patient again later.

## 2016-08-06 NOTE — Telephone Encounter (Signed)
No answer, no voicemail box set up.

## 2016-08-07 NOTE — Telephone Encounter (Signed)
No answer, no voicemail box set up.

## 2016-08-08 ENCOUNTER — Encounter: Payer: Self-pay | Admitting: Family Medicine

## 2016-08-08 NOTE — Telephone Encounter (Signed)
Letter sent as unable to reach him.

## 2016-08-08 NOTE — Telephone Encounter (Signed)
No answer, no voicemail box set up.

## 2016-09-02 ENCOUNTER — Encounter: Payer: 59 | Admitting: Family Medicine

## 2016-09-02 NOTE — Progress Notes (Deleted)
There were no vitals taken for this visit.   Subjective:    Patient ID: Dustin Gross, male    DOB: [birth year], 21 y.o.   MRN: [id number]  HPI: Dustin Gross is a 21 y.o. male presenting on 09/02/2016 for comprehensive medical examination. Current medical complaints include:none  He currently lives with: parents Interim Problems from his last visit: no  Depression Screen done today and results listed below:  No flowsheet data found.  Past Medical History:  Past Medical History:  Diagnosis Date  . Depression   . Gastroesophageal reflux    Aggravated by anxiety, nerves & certain foods    Surgical History:  No past surgical history on file.  Medications:  Current Outpatient Prescriptions on File Prior to Visit  Medication Sig  . pantoprazole (PROTONIX) 40 MG tablet Take 1 tablet (40 mg total) by mouth daily.   No current facility-administered medications on file prior to visit.     Allergies:  Allergies  Allergen Reactions  . Penicillins   . Sulfa Antibiotics Hives    Social History:  Social History   Social History  . Marital status: Single    Spouse name: N/A  . Number of children: N/A  . Years of education: N/A   Occupational History  . Not on file.   Social History Main Topics  . Smoking status: Never Smoker  . Smokeless tobacco: Never Used  . Alcohol use 9.6 oz/week    16 Cans of beer per week     Comment: drinks some liquor  . Drug use: No  . Sexual activity: Yes   Other Topics Concern  . Not on file   Social History Narrative  . No narrative on file   History  Smoking Status  . Never Smoker  Smokeless Tobacco  . Never Used   History  Alcohol Use  . 9.6 oz/week  . 16 Cans of beer per week    Comment: drinks some liquor    Family History:  Family History  Problem Relation Age of Onset  . Seizures Father   . Mental illness Father     Past medical history, surgical history, medications, allergies, family history and social  history reviewed with patient today and changes made to appropriate areas of the chart.   ROS  All other ROS negative except what is listed above and in the HPI.      Objective:    There were no vitals taken for this visit.  Wt Readings from Last 3 Encounters:  07/31/16 172 lb (78 kg)  04/22/16 180 lb 6.4 oz (81.8 kg)  04/17/16 182 lb (82.6 kg)    Physical Exam  Results for orders placed or performed in visit on 07/31/16  GC/Chlamydia Probe Amp  Result Value Ref Range   Chlamydia trachomatis, NAA Negative Negative   Neisseria gonorrhoeae by PCR Negative Negative  Microscopic Examination  Result Value Ref Range   WBC, UA None seen 0 - 5 /hpf   RBC, UA None seen 0 - 2 /hpf   Epithelial Cells (non renal) None seen 0 - 10 /hpf   Bacteria, UA None seen None seen/Few  UA/M w/rflx Culture, Routine  Result Value Ref Range   Specific Gravity, UA 1.020 1.005 - 1.030   pH, UA 6.5 5.0 - 7.5   Color, UA Yellow Yellow   Appearance Ur Clear Clear   Leukocytes, UA Negative Negative   Protein, UA Negative Negative/Trace   Glucose, UA Negative Negative   Ketones,  UA Negative Negative   RBC, UA Negative Negative   Bilirubin, UA Negative Negative   Urobilinogen, Ur 0.2 0.2 - 1.0 mg/dL   Nitrite, UA Negative Negative   Microscopic Examination See below:       Assessment & Plan:   Problem List Items Addressed This Visit    None     LABORATORY TESTING:  Health maintenance labs ordered today as discussed above.   IMMUNIZATIONS:   - Tdap: Tetanus vaccination status reviewed: last tetanus booster within 10 years. - Influenza: Up to date - Pneumovax: Not applicable   PATIENT COUNSELING:    Sexuality: Discussed sexually transmitted diseases, partner selection, use of condoms, avoidance of unintended pregnancy  and contraceptive alternatives.   Advised to avoid cigarette smoking.  I discussed with the patient that most people either abstain from alcohol or drink within safe  limits (<=14/week and <=4 drinks/occasion for males, <=7/weeks and <= 3 drinks/occasion for females) and that the risk for alcohol disorders and other health effects rises proportionally with the number of drinks per week and how often a drinker exceeds daily limits.  Discussed cessation/primary prevention of drug use and availability of treatment for abuse.   Diet: Encouraged to adjust caloric intake to maintain  or achieve ideal body weight, to reduce intake of dietary saturated fat and total fat, to limit sodium intake by avoiding high sodium foods and not adding table salt, and to maintain adequate dietary potassium and calcium preferably from fresh fruits, vegetables, and low-fat dairy products.    stressed the importance of regular exercise  Injury prevention: Discussed safety belts, safety helmets, smoke detector, smoking near bedding or upholstery.   Dental health: Discussed importance of regular tooth brushing, flossing, and dental visits.   Follow up plan: NEXT PREVENTATIVE PHYSICAL DUE IN 1 YEAR. No Follow-up on file.

## 2016-11-06 ENCOUNTER — Other Ambulatory Visit: Payer: Self-pay | Admitting: Family Medicine

## 2018-05-05 ENCOUNTER — Ambulatory Visit: Payer: 59 | Admitting: Physician Assistant

## 2021-03-12 ENCOUNTER — Other Ambulatory Visit: Payer: Self-pay

## 2021-03-12 ENCOUNTER — Telehealth: Payer: 59 | Admitting: Physician Assistant

## 2021-03-12 ENCOUNTER — Encounter: Payer: Self-pay | Admitting: Physician Assistant

## 2021-03-12 DIAGNOSIS — H60331 Swimmer's ear, right ear: Secondary | ICD-10-CM

## 2021-03-12 DIAGNOSIS — T2210XA Burn of first degree of shoulder and upper limb, except wrist and hand, unspecified site, initial encounter: Secondary | ICD-10-CM

## 2021-03-12 MED ORDER — OFLOXACIN 0.3 % OT SOLN
5.0000 [drp] | Freq: Two times a day (BID) | OTIC | 0 refills | Status: AC
  Filled 2021-03-12: qty 10, 20d supply, fill #0

## 2021-03-12 MED ORDER — SILVER SULFADIAZINE 1 % EX CREA
1.0000 "application " | TOPICAL_CREAM | Freq: Every day | CUTANEOUS | 0 refills | Status: AC
  Filled 2021-03-12: qty 50, 10d supply, fill #0

## 2021-03-12 NOTE — Patient Instructions (Addendum)
Ok Edwards, thank you for joining Margaretann Loveless, PA-C for today's virtual visit.  While this provider is not your primary care provider (PCP), if your PCP is located in our provider database this encounter information will be shared with them immediately following your visit.  Consent: (Patient) Dustin Gross provided verbal consent for this virtual visit at the beginning of the encounter.  Current Medications:  Current Outpatient Medications:    ofloxacin (FLOXIN OTIC) 0.3 % OTIC solution, Place 5 drops into the right ear 2 (two) times daily., Disp: 5 mL, Rfl: 0   silver sulfADIAZINE (SILVADENE) 1 % cream, Apply 1 application topically daily., Disp: 50 g, Rfl: 0   pantoprazole (PROTONIX) 40 MG tablet, TAKE 1 TABLET BY MOUTH DAILY, Disp: 30 tablet, Rfl: 6   Medications ordered in this encounter:  Meds ordered this encounter  Medications   ofloxacin (FLOXIN OTIC) 0.3 % OTIC solution    Sig: Place 5 drops into the right ear 2 (two) times daily.    Dispense:  5 mL    Refill:  0    Order Specific Question:   Supervising Provider    Answer:   Hyacinth Meeker, BRIAN [[id number]]   silver sulfADIAZINE (SILVADENE) 1 % cream    Sig: Apply 1 application topically daily.    Dispense:  50 g    Refill:  0    Order Specific Question:   Supervising Provider    Answer:   Hyacinth Meeker, BRIAN [[id number]]     *If you need refills on other medications prior to your next appointment, please contact your pharmacy*  Follow-Up: Call back or seek an in-person evaluation if the symptoms worsen or if the condition fails to improve as anticipated.  Other Instructions See Below   If you have been instructed to have an in-person evaluation today at a local Urgent Care facility, please use the link below. It will take you to a list of all of our available [facility], including address, phone number and hours of operation. Please do not delay care.  [facility]  If you or a family member  do not have a primary care provider, use the link below to schedule a visit and establish care. When you choose a [facility] primary care physician or advanced practice provider, you gain a long-term partner in health. Find a Primary Care Provider  Learn more about [facility] [place]'s in-office and virtual care options:  - Get Care Now  Otitis Externa  Otitis externa is an infection of the outer ear canal. The outer ear canal is the area between the outside of the ear and the eardrum. Otitis externa issometimes called swimmer's ear. What are the causes? Common causes of this condition include: Swimming in dirty water. Moisture in the ear. An injury to the inside of the ear. An object stuck in the ear. A cut or scrape on the outside of the ear. What increases the risk? You are more likely to develop this condition if you go swimming often. What are the signs or symptoms? The first symptom of this condition is often itching in the ear. Later symptoms of the condition include: Swelling of the ear. Redness in the ear. Ear pain. The pain may get worse when you pull on your ear. Pus coming from the ear. How is this diagnosed? This condition may be diagnosed by examining the ear and testing fluid from theear for bacteria and funguses. How is this treated? This condition may be  treated with: Antibiotic ear drops. These are often given for 10-14 days. Medicines to reduce itching and swelling. Follow these instructions at home: If you were prescribed antibiotic ear drops, use them as told by your health care provider. Do not stop using the antibiotic even if your condition improves. Take over-the-counter and prescription medicines only as told by your health care provider. Avoid getting water in your ears as told by your health care provider. This may include avoiding swimming or water sports for a few days. Keep all follow-up visits as told by your health care provider. This is  important. How is this prevented? Keep your ears dry. Use the corner of a towel to dry your ears after you swim or bathe. Avoid scratching or putting things in your ear. Doing these things can damage the ear canal or remove the protective wax that lines it, which makes it easier for bacteria and funguses to grow. Avoid swimming in lakes, polluted water, or pools that may not have enough chlorine. Contact a health care provider if: You have a fever. Your ear is still red, swollen, painful, or draining pus after 3 days. Your redness, swelling, or pain gets worse. You have a severe headache. You have redness, swelling, pain, or tenderness in the area behind your ear. Summary Otitis externa is an infection of the outer ear canal. Common causes include swimming in dirty water, moisture in the ear, or a cut or scrape in the ear. Symptoms include pain, redness, and swelling of the ear. If you were prescribed antibiotic ear drops, use them as told by your health care provider. Do not stop using the antibiotic even if your condition improves. This information is not intended to replace advice given to you by your health care provider. Make sure you discuss any questions you have with your healthcare provider. Document Revised: 01/01/2018 Document Reviewed: 01/02/2018 Elsevier Patient Education  2022 [employer].

## 2021-03-12 NOTE — Progress Notes (Signed)
Virtual Visit Consent   Dustin Gross, you are scheduled for a virtual visit with a  provider today.     Just as with appointments in the office, your consent must be obtained to participate.  Your consent will be active for this visit and any virtual visit you may have with one of our providers in the next 365 days.     If you have a MyChart account, a copy of this consent can be sent to you electronically.  All virtual visits are billed to your insurance company just like a traditional visit in the office.    As this is a virtual visit, video technology does not allow for your provider to perform a traditional examination.  This may limit your provider's ability to fully assess your condition.  If your provider identifies any concerns that need to be evaluated in person or the need to arrange testing (such as labs, EKG, etc.), we will make arrangements to do so.     Although advances in technology are sophisticated, we cannot ensure that it will always work on either your end or our end.  If the connection with a video visit is poor, the visit may have to be switched to a telephone visit.  With either a video or telephone visit, we are not always able to ensure that we have a secure connection.     I need to obtain your verbal consent now.   Are you willing to proceed with your visit today?    Dustin Gross has provided verbal consent on 03/12/2021 for a virtual visit (video or telephone).   Margaretann Loveless, PA-C   Date: 03/12/2021 3:46 PM   Virtual Visit via Video Note   I, Margaretann Loveless, connected with  Dustin Gross  ([id number], 10-29-1995) on 03/12/21 at  3:15 PM EDT by a video-enabled telemedicine application and verified that I am speaking with the correct person using two identifiers.  Location: Patient: Virtual Visit Location Patient: Home Provider: Virtual Visit Location Provider: Home Office   I discussed the limitations of evaluation and management  by telemedicine and the availability of in person appointments. The patient expressed understanding and agreed to proceed.    History of Present Illness: Dustin Gross is a 25 y.o. who identifies as a male who was assigned male at birth, and is being seen today for ear pain.  HPI: Otalgia  There is pain in the right ear. This is a new problem. The current episode started in the past 7 days (Monday-Tuesday of last week, Saturday worsened). The problem has been gradually worsening. Maximum temperature: yesterday may have had low grade fever; unable to take temperature. The fever has been present for Less than 1 day. The pain is at a severity of 7/10. The pain is moderate. Associated symptoms include ear discharge (dark discharge; 1st day did have some bloody discharge) and hearing loss. Pertinent negatives include no coughing, headaches, rhinorrhea, sore throat or vomiting. Associated symptoms comments: Nausea with pain. He has tried NSAIDs (ibuprofen) for the symptoms. The treatment provided mild relief. There is no history of a chronic ear infection, hearing loss or a tympanostomy tube.   Did go swimming a few days prior to symptom onset.  Patient does also mention that a few days ago he was cooking and got a grease burn on his right arm.  Problems:  Patient Active Problem List   Diagnosis Date Noted   Gastroesophageal reflux     Allergies:  Allergies  Allergen Reactions   Penicillins    Sulfa Antibiotics Hives   Medications:  Current Outpatient Medications:    ofloxacin (FLOXIN OTIC) 0.3 % OTIC solution, Place 5 drops into the right ear 2 (two) times daily., Disp: 5 mL, Rfl: 0   silver sulfADIAZINE (SILVADENE) 1 % cream, Apply 1 application topically daily., Disp: 50 g, Rfl: 0   pantoprazole (PROTONIX) 40 MG tablet, TAKE 1 TABLET BY MOUTH DAILY, Disp: 30 tablet, Rfl: 6  Observations/Objective: Patient is well-developed, well-nourished in no acute distress.  Resting comfortably at  home.  Head is normocephalic, atraumatic.  No labored breathing.  Speech is clear and coherent with logical content.  Patient is alert and oriented at baseline.    Assessment and Plan: 1. Acute swimmer's ear of right side - ofloxacin (FLOXIN OTIC) 0.3 % OTIC solution; Place 5 drops into the right ear 2 (two) times daily.  Dispense: 5 mL; Refill: 0  2. Burn of right arm, first degree, initial encounter - silver sulfADIAZINE (SILVADENE) 1 % cream; Apply 1 application topically daily.  Dispense: 50 g; Refill: 0  - Ofloxacin drops prescribed for right ear pain and discharge - Advised to monitor for fever worsening, chills, nausea, vomiting, dizziness, may be inner ear infection and require systemic antibiotics; he voiced understanding - Silvadene cream for the burn - Seek in person evaluation for any worsening symptoms  Follow Up Instructions: I discussed the assessment and treatment plan with the patient. The patient was provided an opportunity to ask questions and all were answered. The patient agreed with the plan and demonstrated an understanding of the instructions.  A copy of instructions were sent to the patient via MyChart.  The patient was advised to call back or seek an in-person evaluation if the symptoms worsen or if the condition fails to improve as anticipated.  Time:  I spent 11 minutes with the patient via telehealth technology discussing the above problems/concerns.    Margaretann Loveless, PA-C

## 2024-06-06 ENCOUNTER — Emergency Department
Admission: EM | Admit: 2024-06-06 | Discharge: 2024-06-06 | Disposition: A | Discharge status: Home / Self Care | Payer: Self-pay | Attending: Emergency Medicine | Admitting: Emergency Medicine

## 2024-06-06 ENCOUNTER — Other Ambulatory Visit: Payer: Self-pay

## 2024-06-06 DIAGNOSIS — W260XXA Contact with knife, initial encounter: Secondary | ICD-10-CM | POA: Insufficient documentation

## 2024-06-06 DIAGNOSIS — S61210A Laceration without foreign body of right index finger without damage to nail, initial encounter: Secondary | ICD-10-CM | POA: Insufficient documentation

## 2024-06-06 DIAGNOSIS — Z23 Encounter for immunization: Secondary | ICD-10-CM | POA: Insufficient documentation

## 2024-06-06 MED ORDER — LIDOCAINE HCL (PF) 1 % IJ SOLN
10.0000 mL | Freq: Once | INTRAMUSCULAR | Status: AC
  Administered 2024-06-06: 10 mL
  Filled 2024-06-06: qty 10

## 2024-06-06 MED ORDER — BACITRACIN ZINC 500 UNIT/GM EX OINT: TOPICAL_OINTMENT | Freq: Two times a day (BID) | CUTANEOUS | Status: DC

## 2024-06-06 MED ORDER — TETANUS-DIPHTH-ACELL PERTUSSIS 5-2-15.5 LF-MCG/0.5 IM SUSP
0.5000 mL | Freq: Once | INTRAMUSCULAR | Status: AC
  Administered 2024-06-06: 0.5 mL via INTRAMUSCULAR
  Filled 2024-06-06: qty 0.5

## 2024-06-06 NOTE — ED Provider Notes (Signed)
 [facility] Provider Note    Event Date/Time   First MD Initiated Contact with Patient 06/06/24 0601     (approximate)   History   Laceration   HPI  Dustin Gross is a 28 y.o. male   Past medical history of no significant past medical history presents emergency department with laceration to his right index finger.  He was at a party drinking alcohol and was using a knife and accidentally cut himself.  Hemostatic with pressure.  Able to range fully.  Unknown last tetanus, medical chart review reveals last tetanus 2014.  No other injuries.  Not self-inflicted.  Independent Historian contributed to assessment above: Brother and wife are at bedside to corroborate information above    Physical Exam   Triage Vital Signs: ED Triage Vitals  Encounter Vitals Group     BP      Girls Systolic BP Percentile      Girls Diastolic BP Percentile      Boys Systolic BP Percentile      Boys Diastolic BP Percentile      Pulse      Resp      Temp      Temp src      SpO2      Weight      Height      Head Circumference      Peak Flow      Pain Score      Pain Loc      Pain Education      Exclude from Growth Chart     Most recent vital signs: Vitals:   06/06/24 0603  BP: (!) 154/92  Pulse: 85  Resp: 16  Temp: (!) 97.5 F (36.4 C)  SpO2: 96%    General: Awake, no distress.  CV:  Good peripheral perfusion.  Resp:  Normal effort.  Abd:  No distention.  Other:  Gaping laceration to the radial side of the right index finger, hemostatic.  Able to fully extend and flex the finger.  Neurovascular intact distally.   ED Results / Procedures / Treatments   Labs (all labs ordered are listed, but only abnormal results are displayed) Labs Reviewed - No data to display  PROCEDURES:  Critical Care performed: No  .Laceration Repair  Date/Time: 06/06/2024 6:34 AM  Performed by: Cyrena Mylar, MD Authorized by: Cyrena Mylar, MD   Consent:    Consent  obtained:  Verbal   Consent given by:  Patient   Risks discussed:  Infection and poor wound healing   Alternatives discussed:  No treatment Universal protocol:    Procedure explained and questions answered to patient or proxy's satisfaction: yes     Patient identity confirmed:  Verbally with patient Anesthesia:    Anesthesia method:  Nerve block   Block location:  Digital   Block needle gauge:  27 G   Block anesthetic:  Lidocaine 1% w/o epi   Block technique:  Digital   Block injection procedure:  Anatomic landmarks identified, introduced needle, negative aspiration for blood, anatomic landmarks palpated and incremental injection   Block outcome:  Anesthesia achieved Laceration details:    Location:  Finger   Finger location:  R index finger   Length (cm):  1.5   Depth (mm):  2 Treatment:    Area cleansed with:  Povidone-iodine   Irrigation solution:  Tap water   Irrigation method:  Syringe   Debridement:  None Skin repair:    Repair method:  Sutures   Suture size:  5-0   Suture material:  Nylon   Suture technique:  Simple interrupted   Number of sutures:  3 Approximation:    Approximation:  Close Repair type:    Repair type:  Simple Post-procedure details:    Dressing:  Antibiotic ointment and adhesive bandage   Procedure completion:  Tolerated    MEDICATIONS ORDERED IN ED: Medications  bacitracin ointment (has no administration in time range)  Tdap (ADACEL) injection 0.5 mL (0.5 mLs Intramuscular Given 06/06/24 0624)  lidocaine (PF) (XYLOCAINE) 1 % injection 10 mL (10 mLs Other Given by Other 06/06/24 0630)     IMPRESSION / MDM / ASSESSMENT AND PLAN / ED COURSE  I reviewed the triage vital signs and the nursing notes.                                Patient's presentation is most consistent with acute complicated illness / injury requiring diagnostic workup.  Differential diagnosis includes, but is not limited to, finger laceration, tendon or neurovascular  injury, bony injury, foreign body   The patient is on the cardiac monitor to evaluate for evidence of arrhythmia and/or significant heart rate changes.  MDM:    Laceration repaired and tolerated well.  Tdap updated.  No evidence of tendon or neurovascular injury, fortunately.  No foreign body.  Appropriate for discharge.  Anticipatory guidance given, return precautions given, follow-up with PMD or ED for suture removal and wound check.       FINAL CLINICAL IMPRESSION(S) / ED DIAGNOSES   Final diagnoses:  Laceration of right index finger without foreign body without damage to nail, initial encounter     Rx / DC Orders   ED Discharge Orders     None        Note:  This document was prepared using Dragon voice recognition software and may include unintentional dictation errors.    Cyrena Mylar, MD 06/06/24 [phone]

## 2024-06-06 NOTE — ED Triage Notes (Signed)
 Patient arrived POV with complaint of laceration to the right 2nd digit.  Patient reports was drinking tonight and took his knife to stab something and finger slipped down blade.  Bleeding controlled on arrival.

## 2024-06-06 NOTE — Discharge Instructions (Signed)
 Please keep your wound clean by washing at least daily with soap and water.  Apply antibiotic ointment and a bandage daily. If you see any signs of infection like spreading redness, pus coming from the wound, extreme pain, fevers, chills or any other worsening doctor right away or come back to the emergency department  Come back to the emergency department or see your primary doctor or urgent care for suture removal in about 7 days  Thank you for choosing us  for your health care today!  Please see your primary doctor this week for a follow up appointment.   If you have any new, worsening, or unexpected symptoms call your doctor right away or come back to the emergency department for reevaluation.  It was my pleasure to care for you today.   Ginnie EDISON Cyrena, MD
